# Patient Record
Sex: Female | Born: 1995 | Race: White | Hispanic: No | Marital: Single | Smoking: Never smoker
Health system: Southern US, Community
[De-identification: ages and names within clinical notes are randomized; demographics above are authoritative.]

## PROBLEM LIST (undated history)

## (undated) HISTORY — PX: TONSILLECTOMY: SUR1361

---

## 2016-07-11 ENCOUNTER — Emergency Department
Admission: EM | Admit: 2016-07-11 | Discharge: 2016-07-11 | Disposition: A | Discharge status: Home / Self Care | Payer: Federal, State, Local not specified - PPO | Attending: Emergency Medicine | Admitting: Emergency Medicine

## 2016-07-11 ENCOUNTER — Encounter: Payer: Self-pay | Admitting: Emergency Medicine

## 2016-07-11 ENCOUNTER — Emergency Department: Payer: Federal, State, Local not specified - PPO

## 2016-07-11 DIAGNOSIS — R52 Pain, unspecified: Secondary | ICD-10-CM | POA: Diagnosis present

## 2016-07-11 DIAGNOSIS — M791 Myalgia, unspecified site: Secondary | ICD-10-CM

## 2016-07-11 DIAGNOSIS — Z7984 Long term (current) use of oral hypoglycemic drugs: Secondary | ICD-10-CM | POA: Diagnosis not present

## 2016-07-11 DIAGNOSIS — Z79899 Other long term (current) drug therapy: Secondary | ICD-10-CM | POA: Insufficient documentation

## 2016-07-11 DIAGNOSIS — B349 Viral infection, unspecified: Secondary | ICD-10-CM

## 2016-07-11 LAB — INFLUENZA PANEL BY PCR (TYPE A & B)
H1N1 flu by pcr: NOT DETECTED
Influenza A By PCR: NEGATIVE
Influenza B By PCR: NEGATIVE

## 2016-07-11 LAB — BASIC METABOLIC PANEL
ANION GAP: 12 (ref 5–15)
BUN: 6 mg/dL (ref 6–20)
CALCIUM: 9.3 mg/dL (ref 8.9–10.3)
CO2: 20 mmol/L — ABNORMAL LOW (ref 22–32)
Chloride: 103 mmol/L (ref 101–111)
Creatinine, Ser: 0.59 mg/dL (ref 0.44–1.00)
GFR calc Af Amer: >60 mL/min (ref >60)
GLUCOSE: 100 mg/dL — AB (ref 65–99)
POTASSIUM: 3.2 mmol/L — AB (ref 3.5–5.1)
SODIUM: 135 mmol/L (ref 135–145)

## 2016-07-11 LAB — DIFFERENTIAL
BASOS ABS: 0 10*3/uL (ref 0–0.1)
Basophils Relative: 0 %
Eosinophils Absolute: 0 10*3/uL (ref 0–0.7)
Eosinophils Relative: 0 %
LYMPHS ABS: 1 10*3/uL (ref 1.0–3.6)
Lymphocytes Relative: 5 %
MONO ABS: 1.6 10*3/uL — AB (ref 0.2–0.9)
MONOS PCT: 9 %
NEUTROS ABS: 16.2 10*3/uL — AB (ref 1.4–6.5)
Neutrophils Relative %: 86 %

## 2016-07-11 LAB — CBC
HCT: 40.6 % (ref 35.0–47.0)
Hemoglobin: 13.6 g/dL (ref 12.0–16.0)
MCH: 29.5 pg (ref 26.0–34.0)
MCHC: 33.4 g/dL (ref 32.0–36.0)
MCV: 88.1 fL (ref 80.0–100.0)
PLATELETS: 255 10*3/uL (ref 150–440)
RBC: 4.61 MIL/uL (ref 3.80–5.20)
RDW: 13.4 % (ref 11.5–14.5)
WBC: 19.1 10*3/uL — AB (ref 3.6–11.0)

## 2016-07-11 LAB — CK: CK TOTAL: 171 U/L (ref 38–234)

## 2016-07-11 LAB — MONONUCLEOSIS SCREEN: Mono Screen: NEGATIVE

## 2016-07-11 LAB — POCT RAPID STREP A: Streptococcus, Group A Screen (Direct): NEGATIVE

## 2016-07-11 MED ORDER — SODIUM CHLORIDE 0.9 % IV BOLUS (SEPSIS)
1000.0000 mL | Freq: Once | INTRAVENOUS | Status: AC
  Administered 2016-07-11: 1000 mL via INTRAVENOUS

## 2016-07-11 MED ORDER — KETOROLAC TROMETHAMINE 30 MG/ML IJ SOLN
30.0000 mg | Freq: Once | INTRAMUSCULAR | Status: AC
Start: 2016-07-11 — End: 2016-07-11
  Administered 2016-07-11: 30 mg via INTRAVENOUS
  Filled 2016-07-11: qty 1

## 2016-07-11 NOTE — ED Triage Notes (Signed)
Patient ambulatory to triage with steady gait, without difficulty or distress noted; pt reports chills since yesterday with body aches

## 2016-07-11 NOTE — ED Notes (Signed)
Pt was given discharge instructions but left without signing.

## 2016-07-11 NOTE — ED Notes (Signed)
Pt ambulated to room without difficulty or distress with large backpack on. Pt reports that she has been having body aches over the last several days like she has a viral illness, however it has "gotten much, much worse." Pt states, "I can't support my head holding my neck" while holding the front of her neck with her hand. When asked if her neck hurts pt states, "yes, but really my whole body hurts, anytime I move any joint it makes anything hurt." Pt is able to touch her chin to her chest without difficulty but reports it hurts to try and move her ear to either shoulder. Pt reports that she spiked a fever to 103 earlier tonight so she took 2 Ibuprofen. She talked to her mom who advised her to take some clindamycin, then decided to come to the ER because she was so uncomfortable she couldn't stay at home. Pt reports that she took an [employer] here because "I was way too uncomfortable to drive." Pt denies any cough, congestion. Reports living in a dorm and when asked if she has been around anyone who has been sick, reports "Oh yea, everyone is sick."

## 2016-07-11 NOTE — ED Notes (Signed)
Pt returned from xray

## 2016-07-11 NOTE — ED Notes (Signed)
Patient transported to X-ray 

## 2016-07-11 NOTE — ED Provider Notes (Signed)
[facility] Emergency Department Provider Note   ____________________________________________   First MD Initiated Contact with Patient 07/11/16 [phone]     (approximate)  I have reviewed the triage vital signs and the nursing notes.   HISTORY  Chief Complaint Generalized Body Aches    HPI Melanie Barnes is a 20 y.o. female comes into the hospital today with body aches for the past week and a half. The patient has had some stiff neck and sore throat. She reports that some days have been better than others. She's had her tonsils out and may due to multiple episodes of strep. She reports that she still felt bad today and it is much worse than it had been previously. The patient once cold throughout the day but then this afternoon she had some sweats and chills. She reports that she called her mom who told her to start taking clindamycin for strep throat. The patient reports that her neck felt really stiff and she couldn't move it. Her temperature was 103.5. She felt nauseous and chills so she decided to come into the hospital today she only took the clindamycin today. The patient rates her pain a 7 out of 10 in intensity. She reports that there are many people sick with multiple different viral illnesses. The patient has had a cough but denies any headache. The patient is here today for evaluation of her symptoms.   History reviewed. No pertinent past medical history.  There are no active problems to display for this patient.   Past Surgical History:  Procedure Laterality Date  . TONSILLECTOMY      Prior to Admission medications   Medication Sig Start Date End Date Taking? Authorizing Provider  lisdexamfetamine (VYVANSE) 40 MG capsule Take 40 mg by mouth every morning.   Yes Historical Provider, MD  metFORMIN (GLUCOPHAGE) 500 MG tablet Take 1 mg by mouth.   Yes Historical Provider, MD  SYNTHROID 25 MCG tablet Take 25 mcg by mouth daily. 05/11/16  Yes  Historical Provider, MD    Allergies Penicillins  No family history on file.  Social History Social History  Substance Use Topics  . Smoking status: Never Smoker  . Smokeless tobacco: Never Used  . Alcohol use No    Review of Systems Constitutional: fever/chills Eyes: No visual changes. ENT: sore throat. Cardiovascular: Denies chest pain. Respiratory: Denies shortness of breath. Gastrointestinal: No abdominal pain.  No nausea, no vomiting.  No diarrhea.  No constipation. Genitourinary: Negative for dysuria. Musculoskeletal: Body aches Skin: Negative for rash. Neurological: Negative for headaches, focal weakness or numbness.  10-point ROS otherwise negative.  ____________________________________________   PHYSICAL EXAM:  VITAL SIGNS: ED Triage Vitals  Enc Vitals Group     BP 07/11/16 0254 136/76     Pulse Rate 07/11/16 0254 (!) 120     Resp 07/11/16 0254 18     Temp 07/11/16 0254 99 F (37.2 C)     Temp Source 07/11/16 0254 Oral     SpO2 07/11/16 0254 99 %     Weight 07/11/16 0251 125 lb (56.7 kg)     Height 07/11/16 0251 5\' 1"  (1.549 m)     Head Circumference --      Peak Flow --      Pain Score 07/11/16 0251 7     Pain Loc --      Pain Edu? --      Excl. in GC? --     Constitutional: Alert and oriented. Well appearing  and in Mild distress. Eyes: Conjunctivae are normal. PERRL. EOMI. Head: Atraumatic. Nose: No congestion/rhinnorhea. Mouth/Throat: Mucous membranes are moist.  Oropharynx non-erythematous. Neck: No cervical spine tenderness to palpation, supple with some discomfort Cardiovascular: Tachycardia, regular rhythm. Grossly normal heart sounds.  Good peripheral circulation. Respiratory: Normal respiratory effort.  No retractions. Lungs CTAB. Gastrointestinal: Soft and nontender. No distention. Positive bowel sounds Musculoskeletal: No lower extremity tenderness nor edema.  Neurologic:  Normal speech and language.  Skin:  Skin is warm, dry and  intact.  Psychiatric: Mood and affect are normal.   ____________________________________________   LABS (all labs ordered are listed, but only abnormal results are displayed)  Labs Reviewed  CBC - Abnormal; Notable for the following:       Result Value   WBC 19.1 (*)    All other components within normal limits  BASIC METABOLIC PANEL - Abnormal; Notable for the following:    Potassium 3.2 (*)    CO2 20 (*)    Glucose, Bld 100 (*)    All other components within normal limits  DIFFERENTIAL - Abnormal; Notable for the following:    Neutro Abs 16.2 (*)    Monocytes Absolute 1.6 (*)    All other components within normal limits  CK  MONONUCLEOSIS SCREEN  INFLUENZA PANEL BY PCR (TYPE A & B, H1N1)   ____________________________________________  EKG  None ____________________________________________  RADIOLOGY  None ____________________________________________   PROCEDURES  Procedure(s) performed: None  Procedures  Critical Care performed: No  ____________________________________________   INITIAL IMPRESSION / ASSESSMENT AND PLAN / ED COURSE  Pertinent labs & imaging results that were available during my care of the patient were reviewed by me and considered in my medical decision making (see chart for details).  This is a 20 year old female who comes into the hospital today with body aches. She's been having these symptoms for the past week. The patient reports that she's had some sore throat as well but no cough or other viral symptoms. I did check some blood work and the patient has a white count of 19. The patient's mononucleosis screen is negative. I will check a flu swab and I will have a discussion with the patient. The patient's mother is concerned she may have meningitis because of the body aches and neck pain. The patient does not have any headache at this time. The patient did receive a dose of Toradol. I will reassess the patient.  Clinical Course    I  discussed the results with the patient as well as with her father. The patient's mother is concerned about the possibility of meningitis. The patient does not have a headache although she did have a fever. She does have some body aches and neck pain. I discussed with the patient performing a spinal tap in an effort to discover if she has meningitis. Given the protracted course of the patient's illness I feel she may be more consistent with viral meningitis versus acute bacterial meningitis. The patient reports that she did not feel that a spinal tap was necessary. She spoke with her parents and she felt okay refusing the spinal tap at this time. She did report though that she has felt this way with strep in the past and she is also felt this way with pneumonia in the past without having any other symptoms. She reports that she did have a cough for a few weeks prior to this illness. I will send the patient for strep and a chest x-ray. The patient's  care was signed out to Dr. Cyril Loosen who will discuss the results of the patient's studies and he will disposition the patient. ____________________________________________   FINAL CLINICAL IMPRESSION(S) / ED DIAGNOSES  Final diagnoses:  Myalgia  Viral syndrome  Body aches      NEW MEDICATIONS STARTED DURING THIS VISIT:  New Prescriptions   No medications on file     Note:  This document was prepared using Dragon voice recognition software and may include unintentional dictation errors.    Rebecka Apley, MD 07/11/16 [phone]

## 2016-07-11 NOTE — ED Notes (Signed)
EDP in with pt 

## 2016-07-11 NOTE — ED Provider Notes (Signed)
Received signout from Dr. Zenda AlpersWebster, she asked me to follow-up on rapid strep, chest x-ray and influenza PCR. Patient refused lumbar puncture. Chest x-ray is normal, rapid strep is negative, pending influenza PCR  ----------------------------------------- 9:23 AM on 07/11/2016 -----------------------------------------  Flu negative, discussed with patient. She understands the need to return if any worsening symptoms or any concerns at all. Overall the patient is well-appearing and her vital signs are unremarkable. She is comfortable with the plan   ----------------------------------------- 9:45 AM on 07/11/2016 -----------------------------------------  Discussed with patient's father the plan. He asked us to fax [facility] which has been [facility]. He concurs with not doing spinal tap.   Jene Everyobert Breslin Burklow, MD 07/11/16 [phone]

## 2016-07-11 NOTE — Discharge Instructions (Signed)
Your tests today were reassuring. We suspect a viral illness is causing your symptoms. Please [facility] as [facility]. Return to the ED if you feel worse.

## 2016-07-14 LAB — CULTURE, GROUP A STREP (THRC)

## 2017-08-14 IMAGING — CR DG CHEST 2V
1 series · 2 of 2 positions shown · non-contrast
Comparison: No prior for comparison

CLINICAL DATA: 19-year-old female with a history of sore throat.
Night sweats.

EXAM:
CHEST  2 VIEW

[Series 1: dg chest 2 view · 0.14mm/px · 2 of 2 slices shown]
[im 1/2]
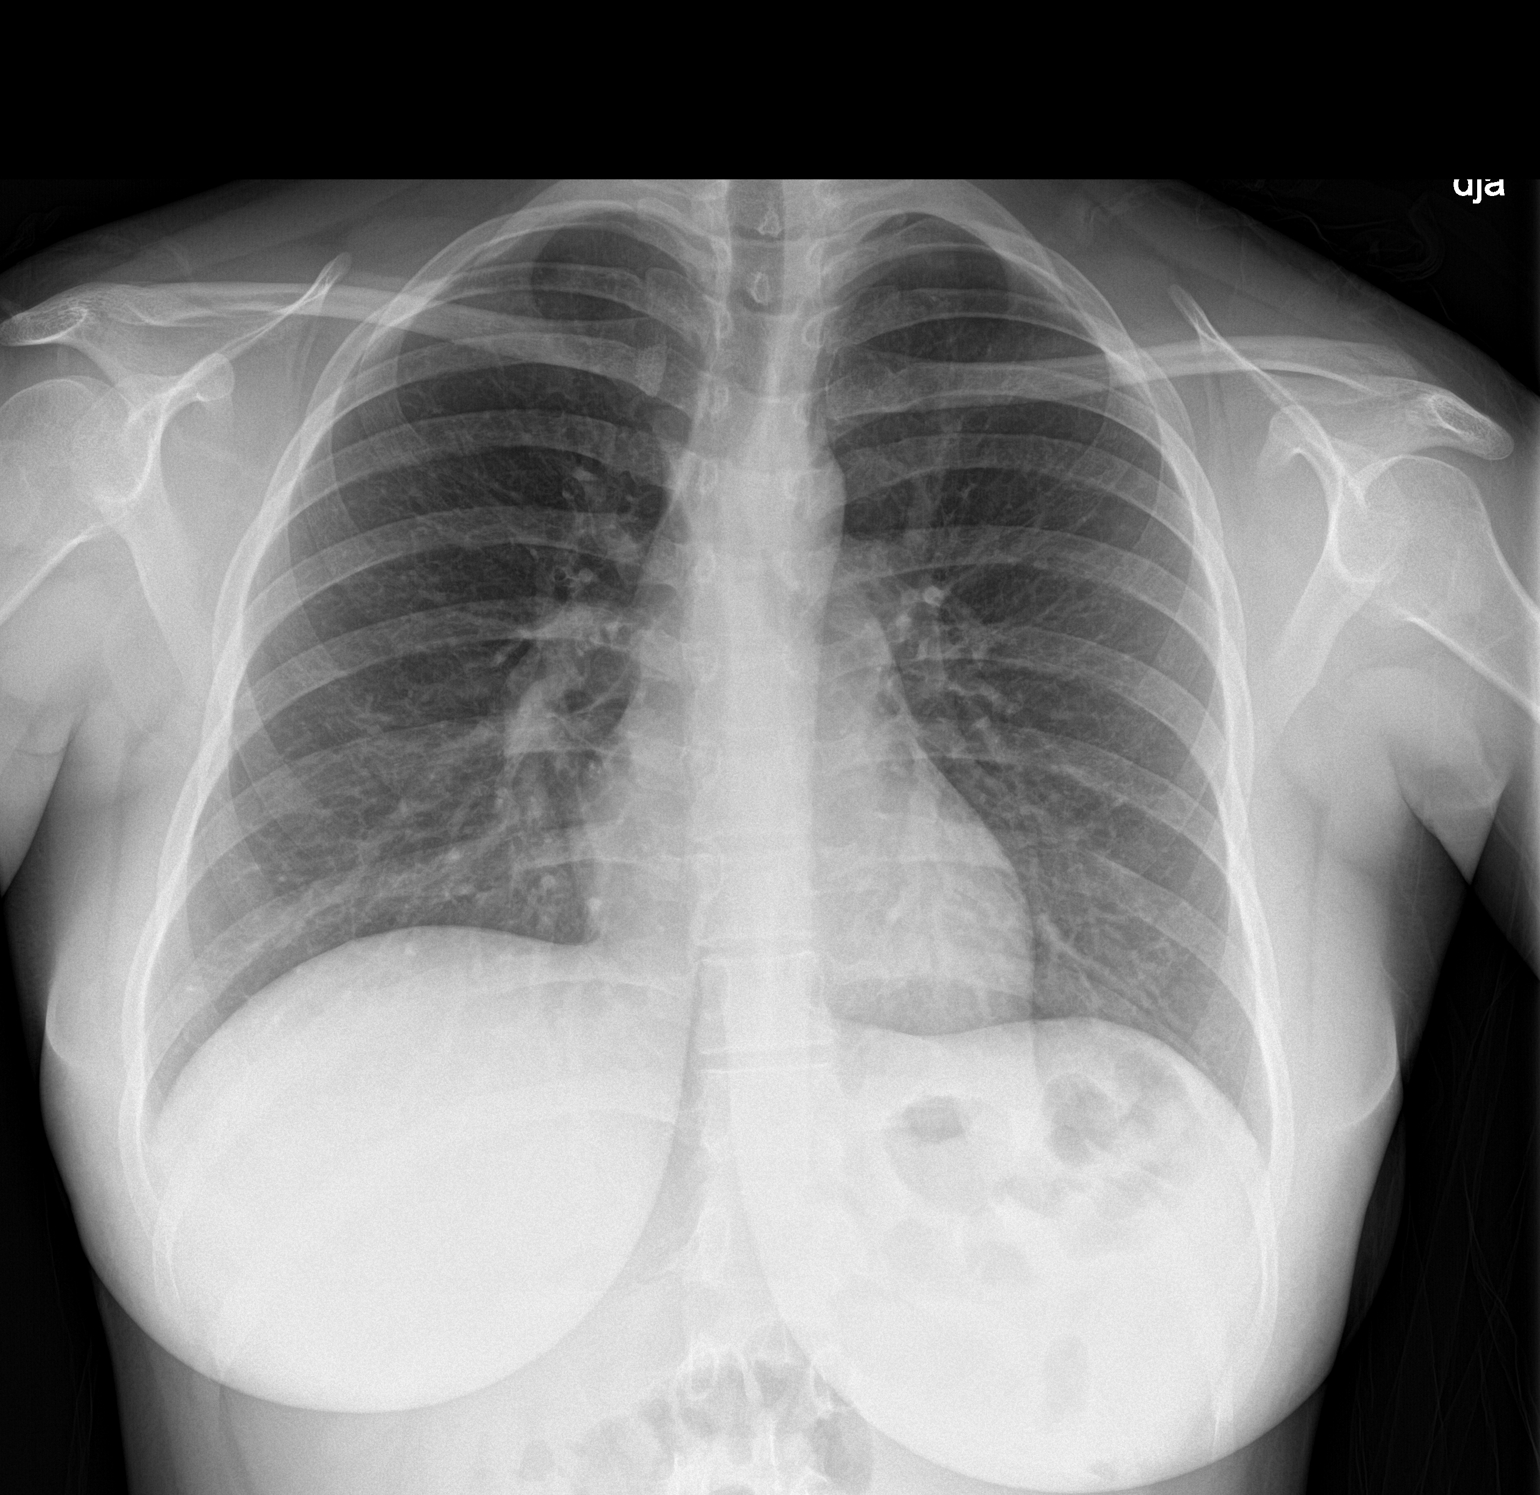
[im 2/2]
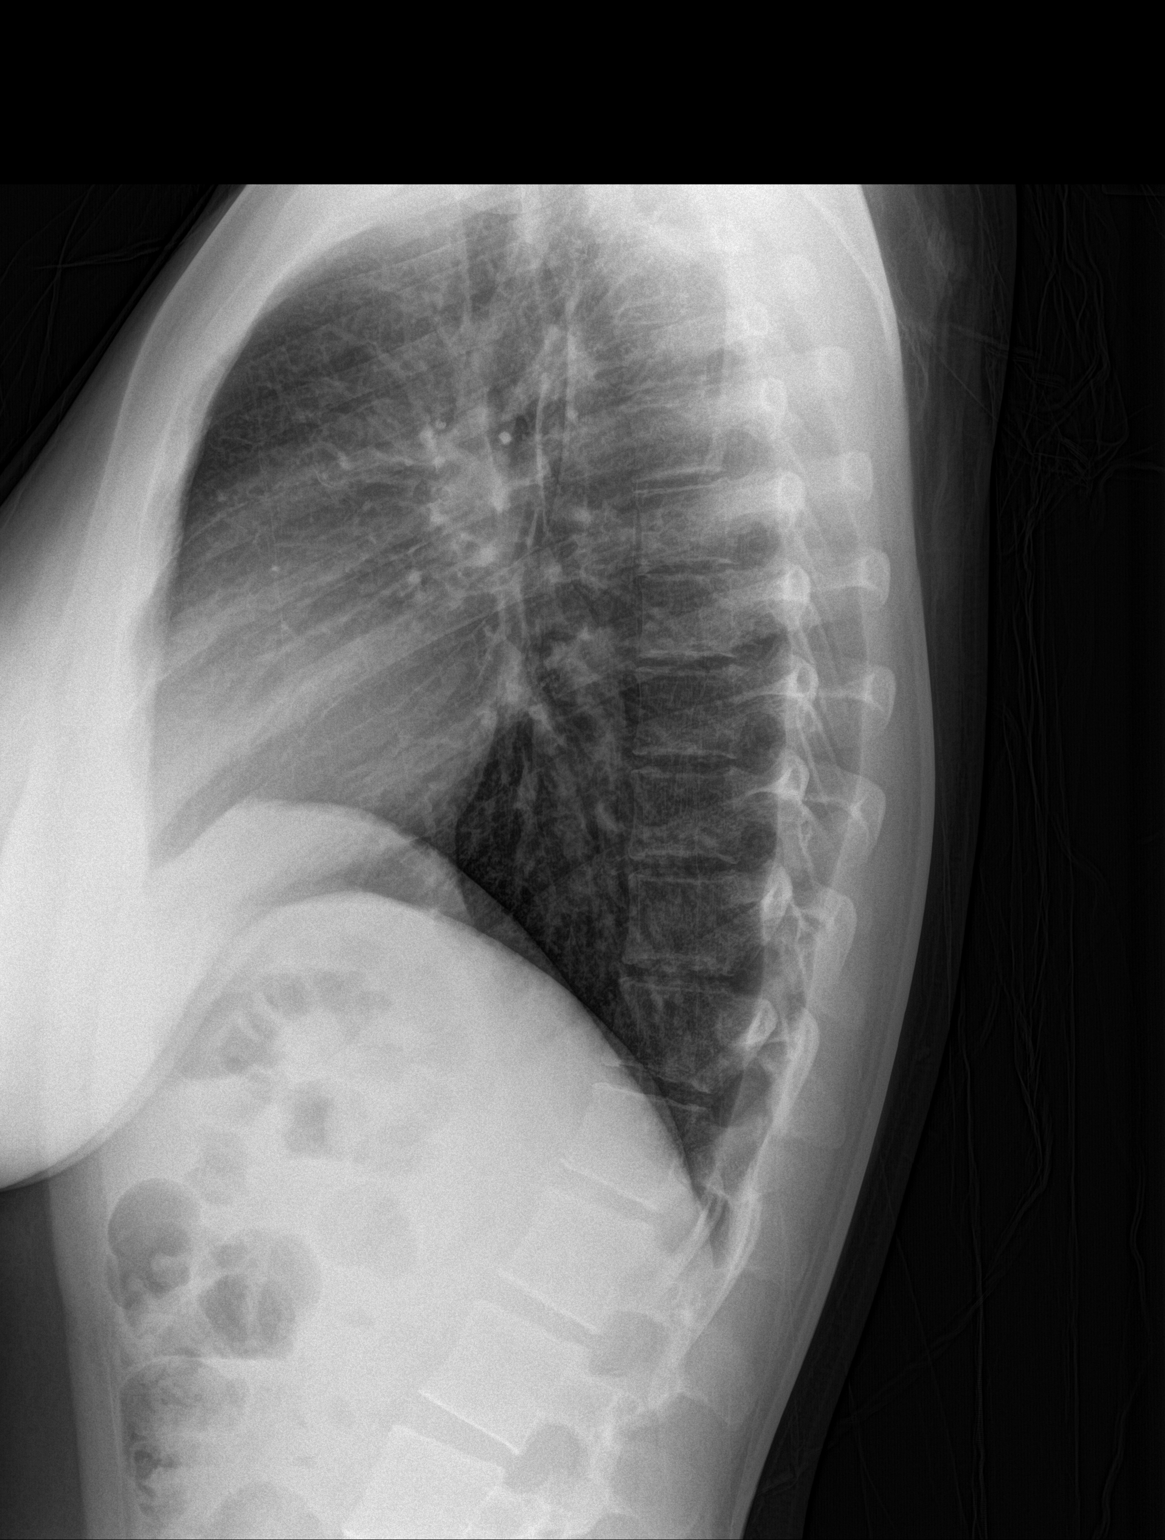

[2 of 2 positions shown; findings below may reference images not displayed]

FINDINGS: The heart size and mediastinal contours are within normal limits.
Both lungs are clear. The visualized skeletal structures are
unremarkable.
IMPRESSION: No radiographic evidence of acute cardiopulmonary disease.
